# Patient Record
Sex: Female | Born: 1935 | Race: White | Hispanic: No | Marital: Married
Health system: Southern US, Community
[De-identification: ages and names within clinical notes are randomized; demographics above are authoritative.]

---

## 2004-02-09 ENCOUNTER — Other Ambulatory Visit: Payer: Self-pay

## 2004-02-09 ENCOUNTER — Inpatient Hospital Stay: Payer: Self-pay

## 2005-09-10 ENCOUNTER — Emergency Department: Payer: Self-pay | Admitting: Emergency Medicine

## 2009-03-25 ENCOUNTER — Emergency Department: Payer: Self-pay | Admitting: Emergency Medicine

## 2009-11-27 ENCOUNTER — Inpatient Hospital Stay: Payer: Self-pay | Admitting: Internal Medicine

## 2010-04-10 ENCOUNTER — Inpatient Hospital Stay: Payer: Self-pay | Admitting: Internal Medicine

## 2010-06-17 ENCOUNTER — Inpatient Hospital Stay: Payer: Self-pay | Admitting: Internal Medicine

## 2011-03-02 ENCOUNTER — Inpatient Hospital Stay: Payer: Self-pay | Admitting: Internal Medicine

## 2011-03-24 ENCOUNTER — Ambulatory Visit: Payer: Self-pay | Admitting: Oncology

## 2011-04-13 ENCOUNTER — Inpatient Hospital Stay: Payer: Self-pay | Admitting: Internal Medicine

## 2011-04-14 ENCOUNTER — Ambulatory Visit: Payer: Self-pay | Admitting: Internal Medicine

## 2011-04-20 ENCOUNTER — Ambulatory Visit: Payer: Self-pay | Admitting: Oncology

## 2011-04-28 LAB — CBC CANCER CENTER
Eosinophil #: 0.1 x10 3/mm (ref 0.0–0.7)
Eosinophil %: 2.2 %
HCT: 25.2 % — ABNORMAL LOW (ref 35.0–47.0)
HGB: 8.1 g/dL — ABNORMAL LOW (ref 12.0–16.0)
Lymphocyte #: 1.2 x10 3/mm (ref 1.0–3.6)
MCH: 28.3 pg (ref 26.0–34.0)
MCHC: 31.9 g/dL — ABNORMAL LOW (ref 32.0–36.0)
MCV: 88.6 fL (ref 80–100)
Monocyte #: 0.5 x10 3/mm (ref 0.0–0.7)
Neutrophil #: 4.9 x10 3/mm (ref 1.4–6.5)
Neutrophil %: 71.6 %
Platelet: 395 x10 3/mm (ref 150–440)
RBC: 2.85 10*6/uL — ABNORMAL LOW (ref 3.80–5.20)

## 2011-05-03 LAB — CBC CANCER CENTER
Basophil #: 0 x10 3/mm (ref 0.0–0.1)
Basophil %: 0.6 %
Eosinophil %: 1.5 %
HGB: 8 g/dL — ABNORMAL LOW (ref 12.0–16.0)
Lymphocyte %: 15.5 %
Monocyte #: 0.7 x10 3/mm (ref 0.0–0.7)
Neutrophil %: 72.6 %
RBC: 2.83 10*6/uL — ABNORMAL LOW (ref 3.80–5.20)
WBC: 7.2 x10 3/mm (ref 3.6–11.0)

## 2011-05-19 LAB — CBC CANCER CENTER
Basophil #: 0 x10 3/mm (ref 0.0–0.1)
Eosinophil #: 0.1 x10 3/mm (ref 0.0–0.7)
HCT: 28.2 % — ABNORMAL LOW (ref 35.0–47.0)
HGB: 9 g/dL — ABNORMAL LOW (ref 12.0–16.0)
Lymphocyte %: 16.4 %
MCH: 27.7 pg (ref 26.0–34.0)
MCV: 86.8 fL (ref 80–100)
Neutrophil #: 3.5 x10 3/mm (ref 1.4–6.5)
Neutrophil %: 70.9 %
RBC: 3.25 10*6/uL — ABNORMAL LOW (ref 3.80–5.20)

## 2011-05-21 ENCOUNTER — Ambulatory Visit: Payer: Self-pay | Admitting: Oncology

## 2011-06-02 LAB — CANCER CENTER HEMOGLOBIN: HGB: 8.7 g/dL — ABNORMAL LOW (ref 12.0–16.0)

## 2011-06-16 LAB — CANCER CENTER HEMOGLOBIN: HGB: 7.3 g/dL — ABNORMAL LOW (ref 12.0–16.0)

## 2011-06-18 ENCOUNTER — Ambulatory Visit: Payer: Self-pay | Admitting: Oncology

## 2011-07-14 LAB — CANCER CENTER HEMOGLOBIN: HGB: 10.1 g/dL — ABNORMAL LOW (ref 12.0–16.0)

## 2011-07-19 ENCOUNTER — Ambulatory Visit: Payer: Self-pay | Admitting: Oncology

## 2011-07-28 LAB — CANCER CENTER HEMOGLOBIN: HGB: 9 g/dL — ABNORMAL LOW (ref 12.0–16.0)

## 2011-08-02 LAB — CBC CANCER CENTER
Basophil %: 0.6 %
Eosinophil #: 0.1 x10 3/mm (ref 0.0–0.7)
Eosinophil %: 1.8 %
HCT: 26.6 % — ABNORMAL LOW (ref 35.0–47.0)
HGB: 8.6 g/dL — ABNORMAL LOW (ref 12.0–16.0)
Lymphocyte #: 1.1 x10 3/mm (ref 1.0–3.6)
Lymphocyte %: 15.6 %
MCH: 27.5 pg (ref 26.0–34.0)
MCV: 85 fL (ref 80–100)
Monocyte #: 0.7 x10 3/mm (ref 0.2–0.9)
Neutrophil #: 5.1 x10 3/mm (ref 1.4–6.5)
Platelet: 276 x10 3/mm (ref 150–440)
RBC: 3.12 10*6/uL — ABNORMAL LOW (ref 3.80–5.20)
WBC: 7.1 x10 3/mm (ref 3.6–11.0)

## 2011-08-11 LAB — CANCER CENTER HEMOGLOBIN: HGB: 8.4 g/dL — ABNORMAL LOW (ref 12.0–16.0)

## 2011-08-18 ENCOUNTER — Ambulatory Visit: Payer: Self-pay | Admitting: Oncology

## 2011-08-18 LAB — CANCER CENTER HEMOGLOBIN: HGB: 10.9 g/dL — ABNORMAL LOW (ref 12.0–16.0)

## 2011-09-01 LAB — CBC CANCER CENTER
Basophil %: 0.6 %
HCT: 33.7 % — ABNORMAL LOW (ref 35.0–47.0)
Lymphocyte #: 0.9 x10 3/mm — ABNORMAL LOW (ref 1.0–3.6)
MCH: 27.7 pg (ref 26.0–34.0)
MCV: 86 fL (ref 80–100)
Monocyte %: 8.6 %
Neutrophil #: 5.3 x10 3/mm (ref 1.4–6.5)
Platelet: 308 x10 3/mm (ref 150–440)
RBC: 3.9 10*6/uL (ref 3.80–5.20)
RDW: 18 % — ABNORMAL HIGH (ref 11.5–14.5)
WBC: 7 x10 3/mm (ref 3.6–11.0)

## 2011-09-18 ENCOUNTER — Ambulatory Visit: Payer: Self-pay | Admitting: Oncology

## 2011-09-22 LAB — CBC CANCER CENTER
Basophil #: 0 x10 3/mm (ref 0.0–0.1)
Basophil %: 0.7 %
Eosinophil #: 0.1 x10 3/mm (ref 0.0–0.7)
HCT: 24 % — ABNORMAL LOW (ref 35.0–47.0)
HGB: 7.7 g/dL — ABNORMAL LOW (ref 12.0–16.0)
Lymphocyte #: 1 x10 3/mm (ref 1.0–3.6)
Lymphocyte %: 15.3 %
MCH: 27.9 pg (ref 26.0–34.0)
MCHC: 32 g/dL (ref 32.0–36.0)
Monocyte #: 0.6 x10 3/mm (ref 0.2–0.9)
Monocyte %: 9.1 %
Neutrophil #: 4.7 x10 3/mm (ref 1.4–6.5)
Platelet: 301 x10 3/mm (ref 150–440)
RBC: 2.75 10*6/uL — ABNORMAL LOW (ref 3.80–5.20)
RDW: 18.6 % — ABNORMAL HIGH (ref 11.5–14.5)

## 2011-10-04 LAB — CBC CANCER CENTER
Eosinophil #: 0.2 x10 3/mm (ref 0.0–0.7)
Eosinophil %: 2.5 %
HCT: 26.5 % — ABNORMAL LOW (ref 35.0–47.0)
HGB: 8.4 g/dL — ABNORMAL LOW (ref 12.0–16.0)
Lymphocyte %: 16.3 %
MCH: 27.5 pg (ref 26.0–34.0)
MCV: 87 fL (ref 80–100)
Monocyte #: 0.7 x10 3/mm (ref 0.2–0.9)
Neutrophil #: 5.4 x10 3/mm (ref 1.4–6.5)
Platelet: 276 x10 3/mm (ref 150–440)
RBC: 3.05 10*6/uL — ABNORMAL LOW (ref 3.80–5.20)

## 2011-10-18 ENCOUNTER — Ambulatory Visit: Payer: Self-pay | Admitting: Oncology

## 2011-10-19 LAB — CBC CANCER CENTER
Basophil #: 0 x10 3/mm (ref 0.0–0.1)
Basophil %: 0.4 %
Eosinophil #: 0.1 x10 3/mm (ref 0.0–0.7)
Eosinophil %: 1.1 %
Lymphocyte #: 0.7 x10 3/mm — ABNORMAL LOW (ref 1.0–3.6)
MCH: 27.5 pg (ref 26.0–34.0)
MCHC: 32.3 g/dL (ref 32.0–36.0)
MCV: 85 fL (ref 80–100)
Monocyte #: 0.5 x10 3/mm (ref 0.2–0.9)
Neutrophil %: 81.9 %
Platelet: 307 x10 3/mm (ref 150–440)
RBC: 3.09 10*6/uL — ABNORMAL LOW (ref 3.80–5.20)
WBC: 7.4 x10 3/mm (ref 3.6–11.0)

## 2011-10-27 LAB — CANCER CENTER HEMOGLOBIN: HGB: 9.5 g/dL — ABNORMAL LOW (ref 12.0–16.0)

## 2011-11-10 LAB — CANCER CENTER HEMOGLOBIN: HGB: 7.6 g/dL — ABNORMAL LOW (ref 12.0–16.0)

## 2011-11-17 LAB — CANCER CENTER HEMOGLOBIN: HGB: 9.3 g/dL — ABNORMAL LOW (ref 12.0–16.0)

## 2011-11-18 ENCOUNTER — Ambulatory Visit: Payer: Self-pay | Admitting: Oncology

## 2011-11-24 LAB — CANCER CENTER HEMOGLOBIN: HGB: 9.6 g/dL — ABNORMAL LOW (ref 12.0–16.0)

## 2011-12-01 LAB — CANCER CENTER HEMOGLOBIN: HGB: 9.1 g/dL — ABNORMAL LOW (ref 12.0–16.0)

## 2011-12-15 LAB — CBC CANCER CENTER
Basophil %: 0.9 %
Eosinophil #: 0.2 x10 3/mm (ref 0.0–0.7)
HCT: 27.3 % — ABNORMAL LOW (ref 35.0–47.0)
HGB: 8.2 g/dL — ABNORMAL LOW (ref 12.0–16.0)
MCH: 24.2 pg — ABNORMAL LOW (ref 26.0–34.0)
MCHC: 30.1 g/dL — ABNORMAL LOW (ref 32.0–36.0)
Monocyte #: 0.6 x10 3/mm (ref 0.2–0.9)
Neutrophil #: 4.8 x10 3/mm (ref 1.4–6.5)
RDW: 16.3 % — ABNORMAL HIGH (ref 11.5–14.5)
WBC: 6.8 x10 3/mm (ref 3.6–11.0)

## 2011-12-19 ENCOUNTER — Ambulatory Visit: Payer: Self-pay | Admitting: Oncology

## 2011-12-22 LAB — CBC CANCER CENTER
Basophil #: 0.1 x10 3/mm (ref 0.0–0.1)
Basophil %: 1 %
Eosinophil #: 0.2 x10 3/mm (ref 0.0–0.7)
HCT: 26.8 % — ABNORMAL LOW (ref 35.0–47.0)
HGB: 8.1 g/dL — ABNORMAL LOW (ref 12.0–16.0)
Lymphocyte %: 16.3 %
MCHC: 30.2 g/dL — ABNORMAL LOW (ref 32.0–36.0)
Monocyte #: 0.6 x10 3/mm (ref 0.2–0.9)
Neutrophil %: 69.8 %
RDW: 16.7 % — ABNORMAL HIGH (ref 11.5–14.5)
WBC: 6.1 x10 3/mm (ref 3.6–11.0)

## 2011-12-26 LAB — COMPREHENSIVE METABOLIC PANEL
Alkaline Phosphatase: 44 U/L — ABNORMAL LOW (ref 50–136)
Anion Gap: 8 (ref 7–16)
Calcium, Total: 8.9 mg/dL (ref 8.5–10.1)
Chloride: 94 mmol/L — ABNORMAL LOW (ref 98–107)
Co2: 30 mmol/L (ref 21–32)
Creatinine: 0.97 mg/dL (ref 0.60–1.30)
EGFR (African American): 58 — ABNORMAL LOW
Osmolality: 272 (ref 275–301)
SGOT(AST): 23 U/L (ref 15–37)
SGPT (ALT): 20 U/L (ref 12–78)
Sodium: 132 mmol/L — ABNORMAL LOW (ref 136–145)
Total Protein: 6.8 g/dL (ref 6.4–8.2)

## 2011-12-26 LAB — URINALYSIS, COMPLETE
Bilirubin,UR: NEGATIVE
Leukocyte Esterase: NEGATIVE
Ph: 5 (ref 4.5–8.0)
Protein: 30
RBC,UR: 4 /HPF (ref 0–5)
Squamous Epithelial: NONE SEEN

## 2011-12-26 LAB — CBC
HGB: 8 g/dL — ABNORMAL LOW (ref 12.0–16.0)
MCH: 23.4 pg — ABNORMAL LOW (ref 26.0–34.0)
MCHC: 30.1 g/dL — ABNORMAL LOW (ref 32.0–36.0)
MCV: 78 fL — ABNORMAL LOW (ref 80–100)

## 2011-12-27 ENCOUNTER — Inpatient Hospital Stay: Payer: Self-pay | Admitting: Internal Medicine

## 2011-12-27 LAB — TROPONIN I: Troponin-I: 0.09 ng/mL — ABNORMAL HIGH

## 2011-12-28 LAB — BASIC METABOLIC PANEL
Anion Gap: 7 (ref 7–16)
Chloride: 91 mmol/L — ABNORMAL LOW (ref 98–107)
Co2: 33 mmol/L — ABNORMAL HIGH (ref 21–32)
Creatinine: 0.85 mg/dL (ref 0.60–1.30)
Osmolality: 268 (ref 275–301)
Potassium: 4 mmol/L (ref 3.5–5.1)

## 2011-12-28 LAB — DIGOXIN LEVEL: Digoxin: 1.26 ng/mL

## 2011-12-29 LAB — BASIC METABOLIC PANEL
Calcium, Total: 9.2 mg/dL (ref 8.5–10.1)
Chloride: 92 mmol/L — ABNORMAL LOW (ref 98–107)
Co2: 33 mmol/L — ABNORMAL HIGH (ref 21–32)
EGFR (African American): >60
Glucose: 116 mg/dL — ABNORMAL HIGH (ref 65–99)
Osmolality: 266 (ref 275–301)
Potassium: 4.2 mmol/L (ref 3.5–5.1)
Sodium: 131 mmol/L — ABNORMAL LOW (ref 136–145)

## 2011-12-29 LAB — HEMOGLOBIN: HGB: 8.9 g/dL — ABNORMAL LOW (ref 12.0–16.0)

## 2011-12-30 LAB — BASIC METABOLIC PANEL
Anion Gap: 5 — ABNORMAL LOW (ref 7–16)
Calcium, Total: 9 mg/dL (ref 8.5–10.1)
Chloride: 92 mmol/L — ABNORMAL LOW (ref 98–107)
Co2: 33 mmol/L — ABNORMAL HIGH (ref 21–32)
Creatinine: 0.77 mg/dL (ref 0.60–1.30)
Potassium: 4.1 mmol/L (ref 3.5–5.1)
Sodium: 130 mmol/L — ABNORMAL LOW (ref 136–145)

## 2011-12-30 LAB — HEMOGLOBIN: HGB: 8.9 g/dL — ABNORMAL LOW (ref 12.0–16.0)

## 2012-01-18 ENCOUNTER — Ambulatory Visit: Payer: Self-pay | Admitting: Oncology

## 2012-01-18 DEATH — deceased

## 2013-09-21 IMAGING — CR DG CHEST 1V PORT
1 series · 1 of 1 positions shown · non-contrast
Comparison: none

REASON FOR EXAM: weakness
COMMENTS:

PROCEDURE:     DXR - DXR PORTABLE CHEST SINGLE VIEW  - March 02, 2011  [DATE]
RESULT:     Comparison: 06/17/2010

[view not recorded]
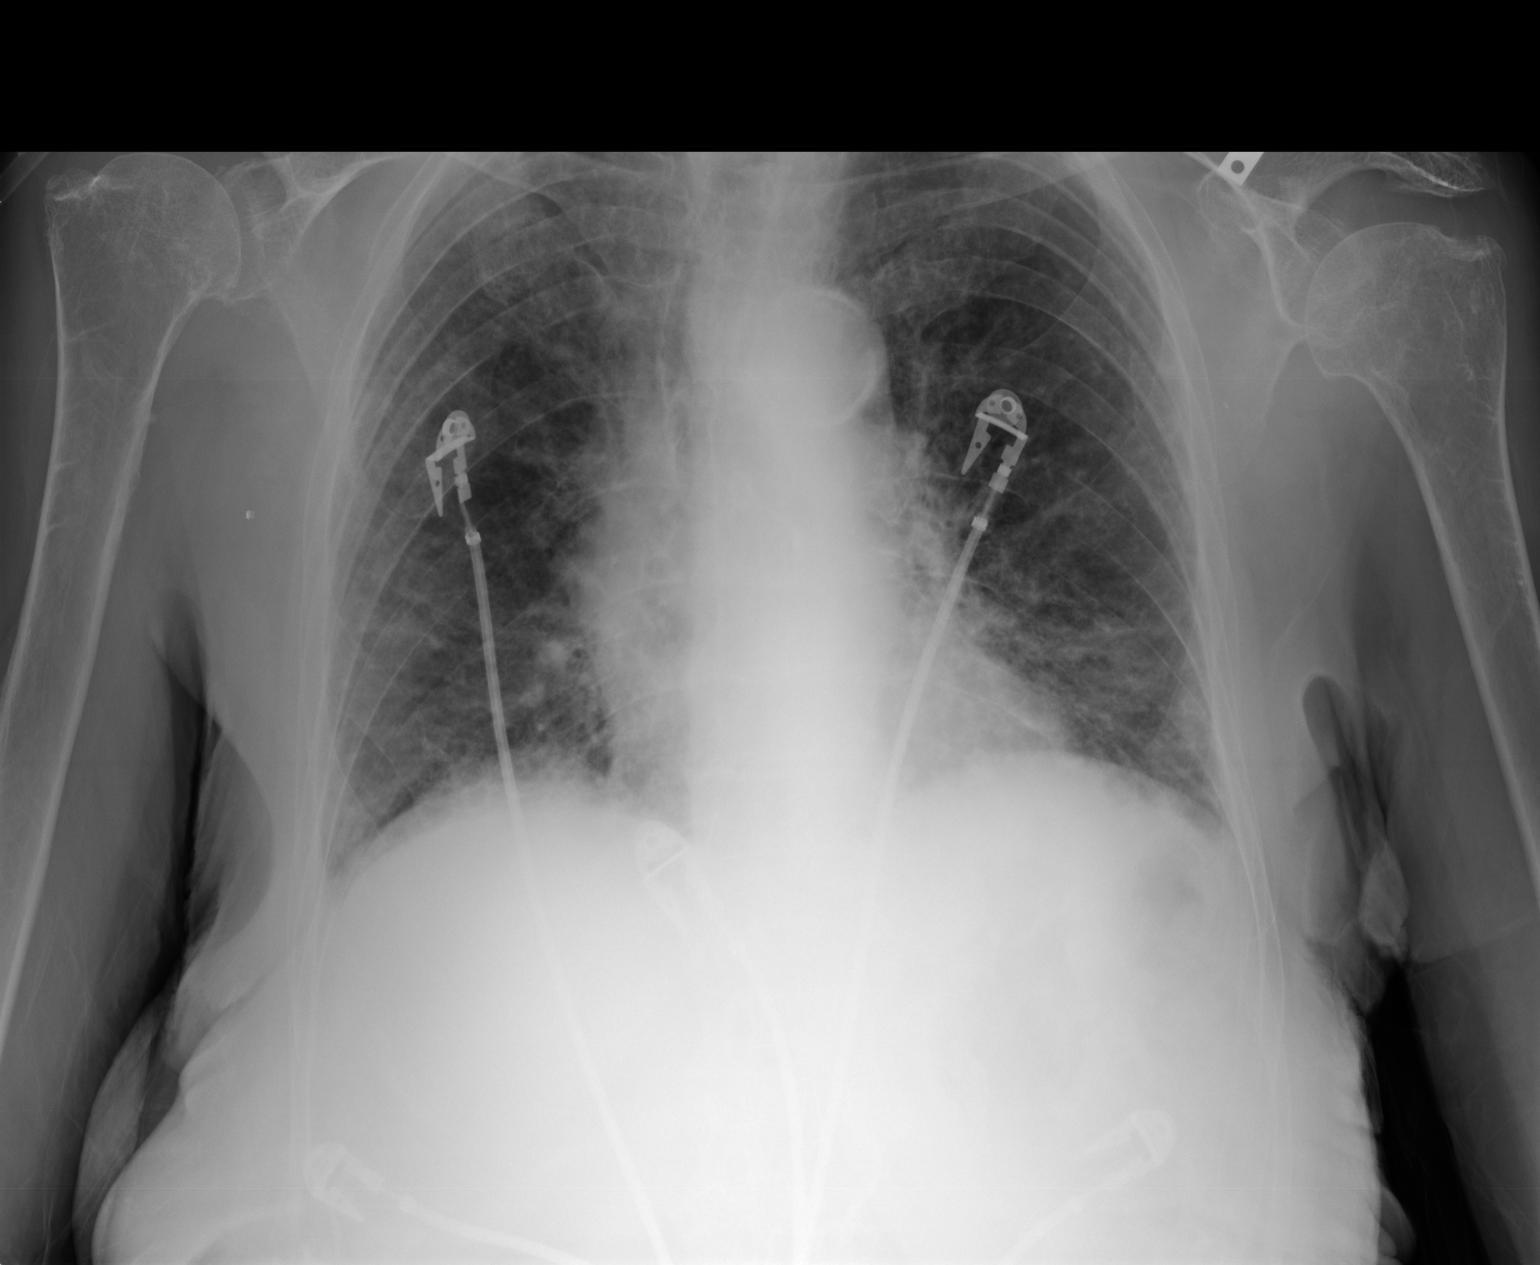

[1 of 1 positions shown; findings below may reference images not displayed]

FINDINGS: The lung volumes are low. The heart and mediastinum are stable, given the
lower lung volumes. There are bilateral interstitial pulmonary opacities,
which likely represent a combination of interstitial pulmonary edema and
atelectasis.
IMPRESSION: Findings likely represent a combination of interstitial pulmonary edema and
atelectasis. Infection would be difficult to exclude. Followup PA and
lateral chest radiographs are recommended.

## 2013-11-01 IMAGING — CR DG CHEST 1V PORT
1 series · 1 of 1 positions shown · non-contrast
Comparison: none

REASON FOR EXAM: SOB
COMMENTS:

[ap]
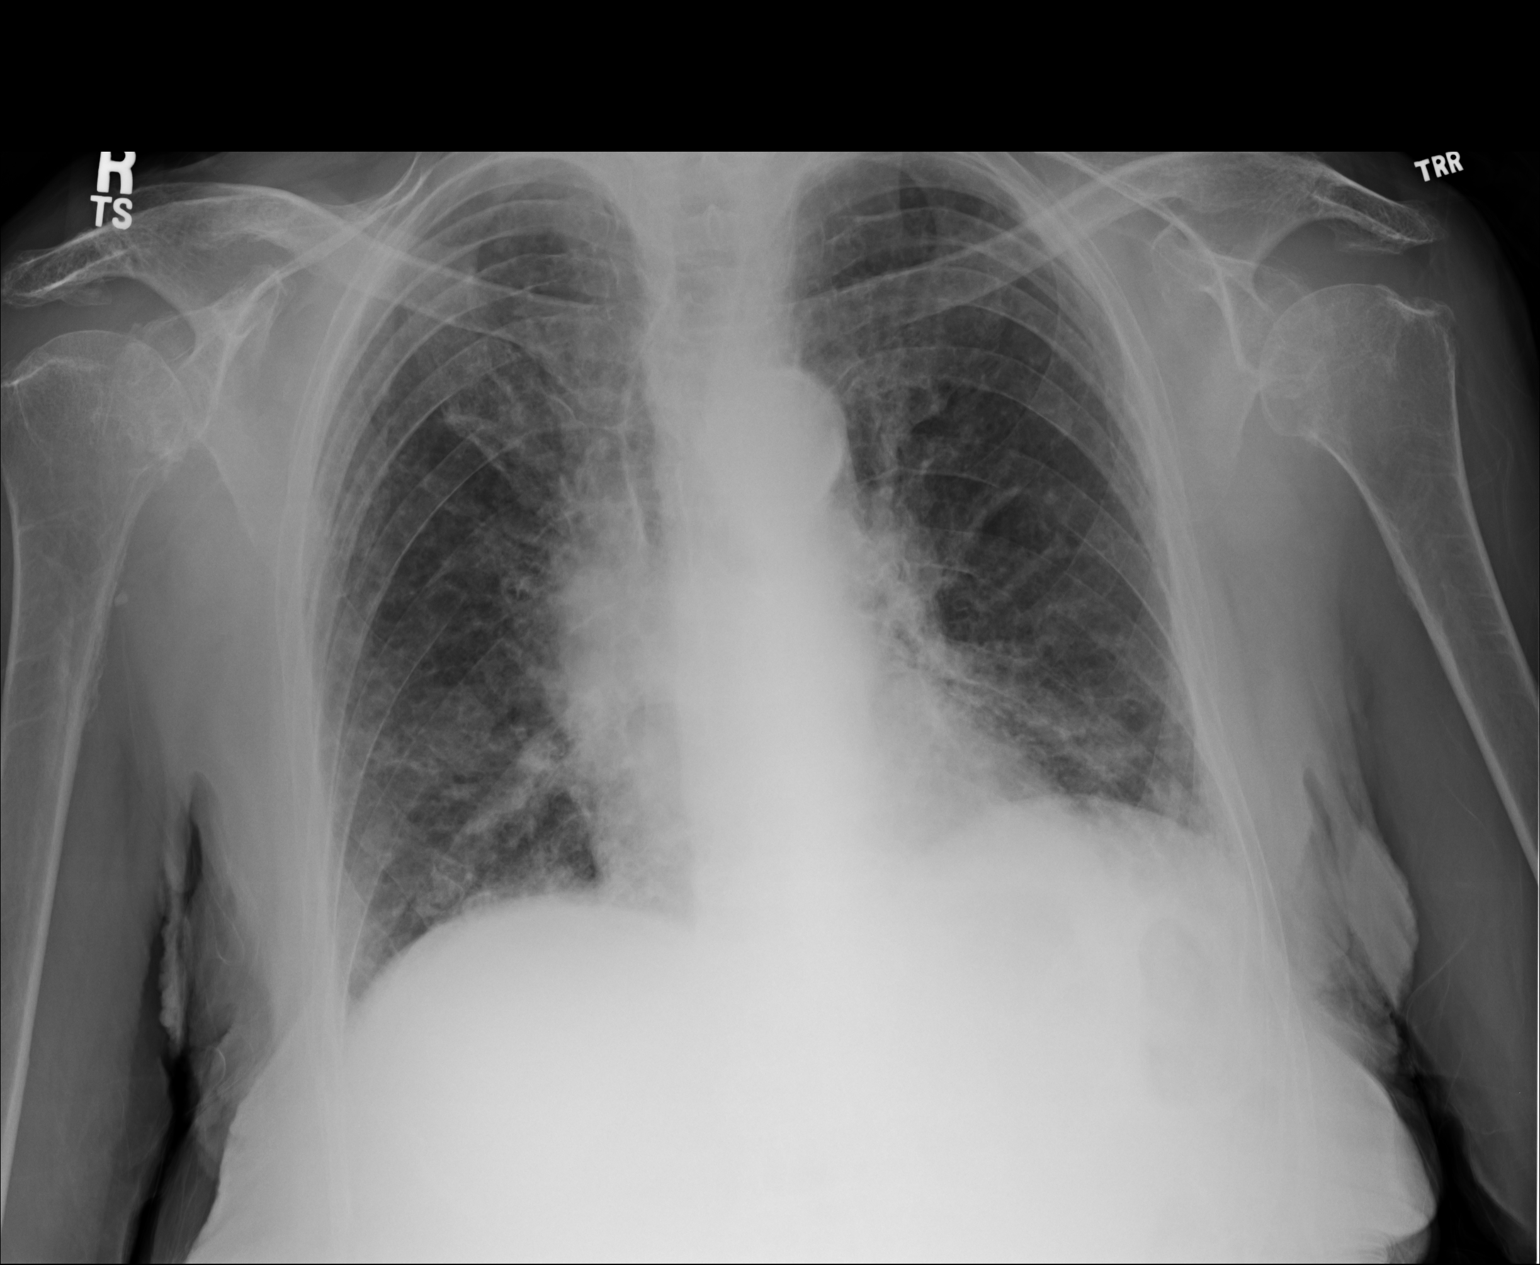

[1 of 1 positions shown; findings below may reference images not displayed]

PROCEDURE:     DXR - DXR PORTABLE CHEST SINGLE VIEW  - April 12, 2011 [DATE]

RESULT:     Comparison is made to the study 02 March, 2011.

The lungs are adequately inflated. The interstitial markings are increased
and the pulmonary vascularity is engorged. The cardiac silhouette is top
normal in size.
IMPRESSION: The findings are consistent with congestive heart failure
and pulmonary interstitial edema. There has been worsening since the
previous study.

## 2014-07-17 IMAGING — CR PELVIS - 1-2 VIEW
1 series · 1 of 1 positions shown · non-contrast
Comparison: none

REASON FOR EXAM: low abd p[ain after fall
COMMENTS:

[t pelvis ap]
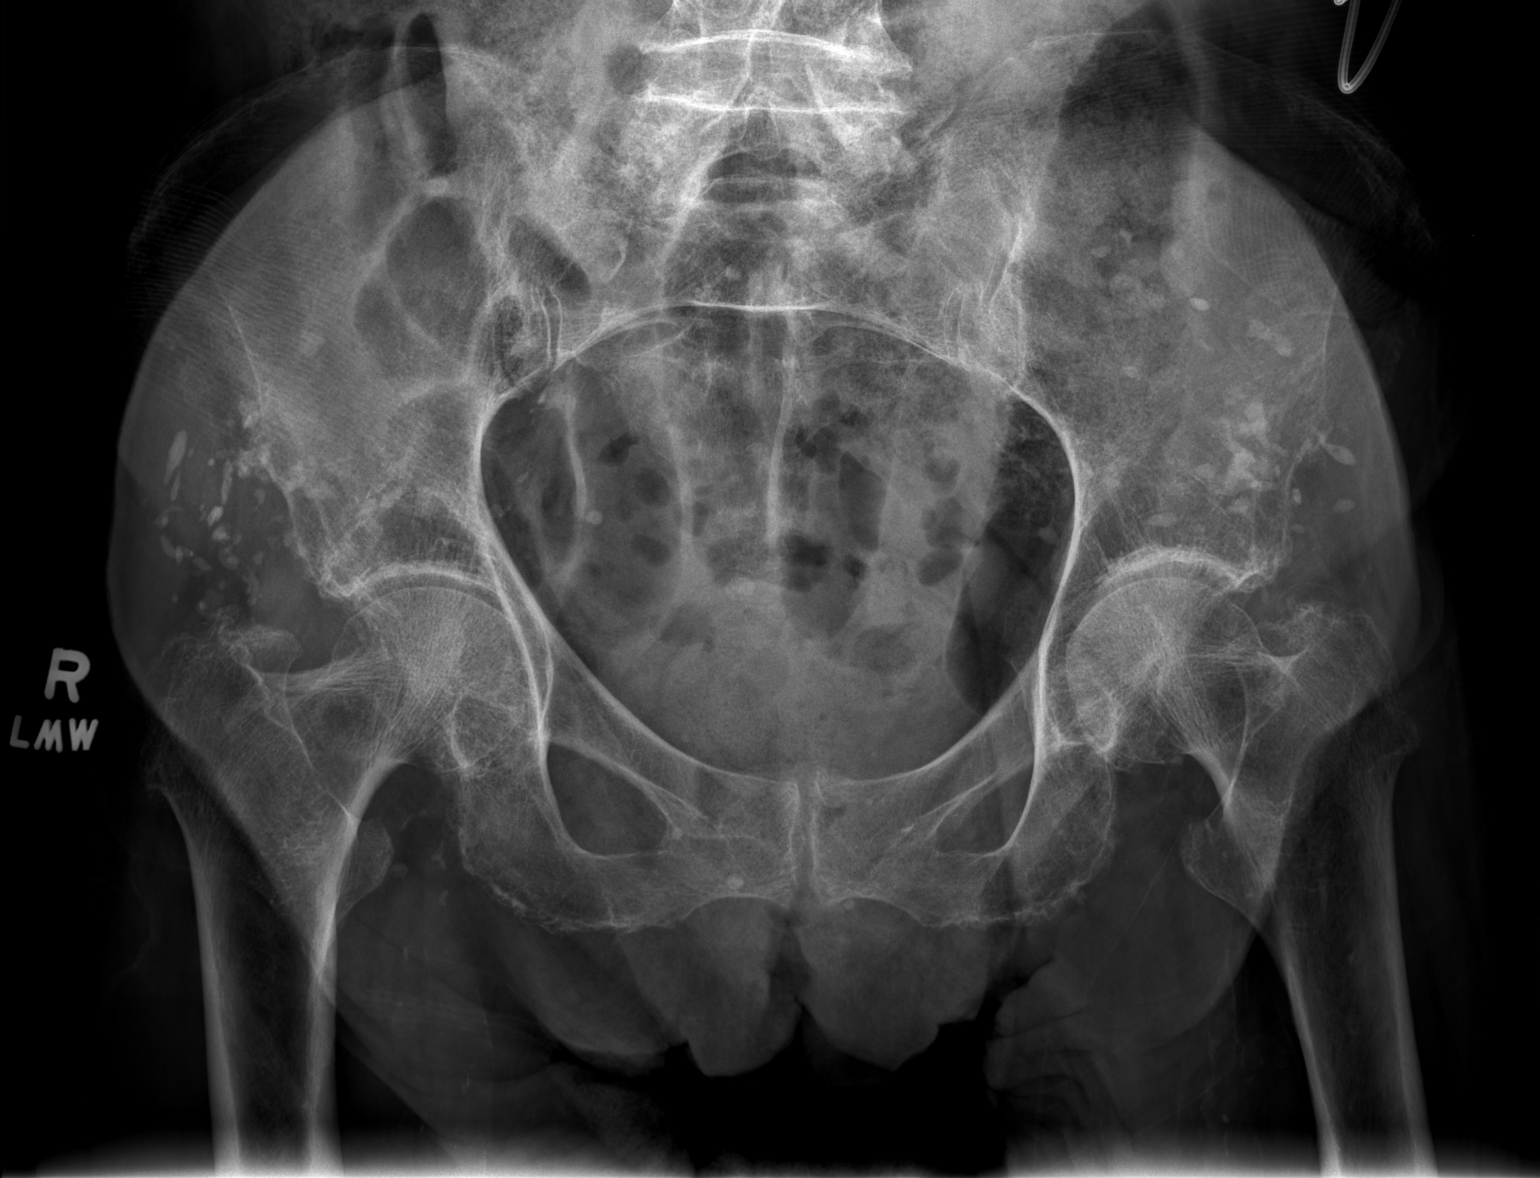

[1 of 1 positions shown; findings below may reference images not displayed]

PROCEDURE:     DXR - DXR PELVIS AP ONLY  - December 26, 2011  [DATE]

RESULT:     The bony pelvis is diffusely osteopenic. There is no evidence of
an acute fracture. Numerous soft tissue calcifications are present over the
gluteal regions which may reflect injection granulomas. The bowel gas
pattern is nonspecific.
IMPRESSION: There is no acute bony abnormality of the pelvis. There is
diffuse osteopenia. Followup imaging is available if there are strong
clinical concerns of an occult fracture.

[REDACTED]

## 2014-08-06 NOTE — H&P (Signed)
PATIENT NAME:  Savannah Pollard, Savannah Pollard MR#:  [id number] DATE OF BIRTH:  October 22, 1916  DATE OF ADMISSION:  12/26/2011  PRIMARY CARE PHYSICIAN: Dr. Barry Brunner.  HEMATOLOGIST: Dr. Orlie Dakin.   CHIEF COMPLAINT: She fell at home and complaining of backache. She also has mild shortness of breath.   HISTORY OF PRESENT ILLNESS: Savannah Pollard is a 81 year old Caucasian female who lives at home cared by her daughters and around-the-clock sitters. The patient was found today on the floor when the sitter came back home. The patient does not give adequate history of what happened exactly. They called EMS and the patient was transported to the Emergency Department for evaluation. Her complaint is backache. She also had mild headache after the fall. The daughter had noticed that she had some increased shortness of breath in the last couple of days. The patient is minimizing that and she is focusing on the backache. She denies having any chest pain. No fever. No cough. The patient had workup in the Emergency Department and that included chest x-ray, x-ray of the pelvis, x-ray of the thoracic and lumbar spine, CT scan of the head and cervical spine as well. The findings were unremarkable except for suggestion of possible fluid overload or congestive heart failure superimposing the pulmonary fibrosis as evident by the chest x-ray. The patient was admitted for 24-hour observation and also to manage the volume overload and pain control.   REVIEW OF SYSTEMS: Ten point system review was very difficult to obtain due to the patient hard of hearing. CONSTITUTIONAL: Denies any fever. No chills. No fatigue, although she has mild fatigue. This is chronic for her. EYES: No blurring of vision. No double vision. ENT: No sore throat. No dysphagia. She has hearing impairment. This is chronic. CARDIOVASCULAR: No chest pain. She denies significant shortness of breath. However, the daughter reports that in the last couple of days she was noticed to be  short of breath. No peripheral edema reported. RESPIRATORY: Mild shortness of breath in the last couple of days. No chest pain. No cough. No hemoptysis. GASTROINTESTINAL: No abdominal pain. No vomiting, no diarrhea. GENITOURINARY: No dysuria. No frequency of urination. MUSCULOSKELETAL: No joint pain or swelling other than the backache. No muscular pain or swelling. INTEGUMENTARY: No skin rash. No ulcers. NEUROLOGY: No focal weakness other than residual left-sided weakness from previous old stroke. No seizure activity. She reports mild headache after the fall. PSYCHIATRY: No anxiety or depression. ENDOCRINE: No polyuria or polydipsia. No heat or cold intolerance.   PAST MEDICAL HISTORY:  1. History of stroke and multiple transient ischemic attacks with residual left-sided weakness. 2. Diabetes mellitus type 2. 3. Hypertension. 4. Atrial fibrillation.  5. Chronic obstructive pulmonary disease.  6. Pulmonary fibrosis.  7. She is on 2 liters of oxygen at bedtime.  8. Severe aortic stenosis. 9. History of chronic diastolic congestive heart failure with normal systolic function.  10. Hyperlipidemia.  11. Gastroesophageal reflux disease.  12. Iron deficiency anemia and also suspected to have myelodysplastic syndrome or MDS followed by Dr. Orlie Dakin.  13. Coronary artery disease. 14. Osteoporosis. 15. Osteoarthritis.   PAST SURGICAL HISTORY:  1. Hysterectomy. 2. Cataract surgery.   SOCIAL HABITS: Nonsmoker. No history of alcohol or drug abuse.   SOCIAL HISTORY: She is widowed. Lives at home, cared for by her daughters and two sisters, one of them stays from 5:30 in the evening and she will leave at 7 in the morning. She has another sister who comes at 7:00 in the morning, then  leaves at 10:00 in the morning. In between, she has her son and daughters who check on her. Her CODE STATUS is DO NOT RESUSCITATE. She has a LIVING WILL.   FAMILY HISTORY: Her mother suffered from a heart attack. She has a  brother who also had a heart attack. She has two brothers and one sister who had cancer, the type is unknown.   ADMISSION MEDICATIONS:  1. Tylenol arthritis p.r.n. for pain. 2. Prilosec 20 mg a day.  3. Potassium chloride 20 mEq a day. 4. Lasix 20 mg daily. 5. Oxycodone 5 to 10 mg p.r.n. for pain.  6. Metoprolol tartrate 25 mg taking 1/2 tablet twice a day. 7. Metformin 500 mg twice a day. 8. Lipitor 40 mg at night. 9. Digoxin 0.125 mg once a day. 10. Fosamax 70 mg once a week on Saturdays.  11. Iron sulfate 325 mg twice a day.  12. Calcium with vitamin D 600 mg twice a day.   ALLERGIES: Levaquin causing skin rash. Plavix and aspirin. the type of allergy is not specified, but possibly related to the low platelets and precautions not to use them.   PHYSICAL EXAMINATION:  VITAL SIGNS: Blood pressure 119/69, respiratory rate 18, pulse 100, temperature 96.9, oxygen saturation 94%.   GENERAL: Elderly female lying in bed in no acute distress.   HEAD AND NECK EXAMINATION: Remarkable for moderate pallor. No icterus. No cyanosis.   ENT: She is hard of hearing and difficult to communicate with her. Nasal mucosa, lips, tongue were normal.   EYES: Normal eyelids and conjunctivae although the conjunctivae are slightly pale. Pupils are about 4 to 5 mm, equal and sluggishly reactive to light.   NECK: Supple. Trachea at midline. No cervical masses. No lymphadenopathies.   HEART: Normal S1, S2. There is a grade 4/6 systolic murmur heard at the apex and left sternal border but best heard at the aortic area. No carotid bruits.   LUNGS: Normal breathing pattern without use of accessory muscles. No rales. No wheezing.   ABDOMEN: Soft without tenderness. No hepatosplenomegaly. No masses. No hernias.   SKIN: No ulcers. No subcutaneous nodules.   MUSCULOSKELETAL: No joint swelling. No clubbing. She is able to move her upper extremities without any problem. She can also move her feet and bend the right  knee and left knee with no difficulty.   NEUROLOGY: Cranial nerves II through XII are intact. No focal motor deficit. There is a suggestion of mild left-sided weakness, especially in the left leg. This is an old finding from her old stroke.   PSYCHIATRY: The patient is alert, oriented to place and people. Mood and affect were normal.   LABORATORY, DIAGNOSTIC, AND RADIOLOGICAL DATA: Her chest x-ray showed findings consistent with her pulmonary fibrosis. There may be superimposed congestive heart failure bilaterally. X-ray of the pelvis showed no acute bony abnormality in the pelvis. There is diffuse osteopenia. X-ray of the thoracic spine showed chronic compression of T12, but no acute compression. X-ray of the lumbar spine showed no evidence of lumbar compression fracture. CT scan of the head showed chronic ischemic changes. No evidence of acute intracranial hemorrhage or any acute abnormality. The CT scan of the cervical spine showed no evidence of acute cervical spine fracture or dislocation. There are degenerative changes in the mid cervical spine. There are emphysematous changes of the apices of the lungs, which also reveals some multiple small subcentimeter nodules. EKG showed sinus tachycardia, first degree AV block. Q waves in V1 and V2  consistent with old anteroseptal myocardial infarction. Nonspecific ST-T wave abnormalities in the lateral leads. Serum glucose 164. B-type natriuretic peptide (BNP) was elevated at 2,457. BUN 24, creatinine 0.9, sodium 132, potassium 4.4. Normal liver function tests and normal liver enzymes. Troponin 0.04. CBC showed white count of 7.900, hemoglobin 8. Her hemoglobin a few days ago was 8.1. Hematocrit 26, platelet count 317. Low indices of MCV, MCH, and MCHC. Urinalysis showed cloudy urine, 4 white blood cells, 4 RBCs, +3 bacteria.   ASSESSMENT:  1. Syncope. The etiology and the circumstances of her syncope are unclear. The patient is not a good historian.  2. Acute  on chronic diastolic congestive heart failure.  3. Mild hyponatremia.  4. Back pain status post fall without evidence of fracture.  5. Pulmonary fibrosis. 6. Chronic obstructive pulmonary disease, home oxygen dependent on 2 liters at night.  7. Severe aortic stenosis.  8. Diabetes mellitus, type II. 9. Hypertension.  10. Hyperlipidemia.  11. Gastroesophageal reflux disease.  12. History of stroke with residual mild left hemiparesis.  13. Multiple transient ischemic attacks.  14. Coronary artery disease. 15. Osteoporosis. 16. Osteoarthritis.   PLAN:  1. Will admit the patient for observation.  2. The patient received one dose of Lasix 40 mg in the Emergency Department. I will give her another one dose in a few hours. I will hold the oral Lasix and can be resumed at time of discharge.  3. Oxygen supplementation at night.  4. Follow up on troponin x2.  5. Monitor the patient over telemetry.  6. Pain control with oxycodone p.r.n. and Tylenol if needed.  7. Consult physical therapy to ensure that the patient is able to go back to her usual state. According to the daughter, she is able to use the walker and the wheelchair also during ambulation.  8. I will continue the rest of her home medications.  9. The patient has a LIVING WILL and it stated that she has a CODE STATUS of DO NOT RESUSCITATE. This is confirmed by her daughter and also her old records in this hospital.   TIME SPENT IN EVALUATING THIS PATIENT: More than 55 minutes.   ____________________________ Carney CornersAmir M. Rudene Rearwish, MD amd:ap D: 12/26/2011 23:13:46 ET T: 12/27/2011 09:15:59 ET JOB#: [id number]  cc: Carney CornersAmir M. Rudene Rearwish, MD, <Dictator> Jorje GuildGlenn R. Beckey DowningWillett, MD Zollie ScaleAMIR M Clarice Zulauf MD ELECTRONICALLY SIGNED 12/27/2011 22:25

## 2014-08-06 NOTE — Discharge Summary (Signed)
PATIENT NAME:  Savannah Pollard, Savannah Pollard MR#:  [id number] DATE OF BIRTH:  28-Dec-1916  DATE OF ADMISSION:  12/27/2011 DATE OF DISCHARGE:  12/30/2011  PRIMARY CARE PHYSICIAN: Dr. Beckey Downing    FINAL DIAGNOSES:  1. Acute diastolic congestive heart failure with severe aortic stenosis, severe mitral regurgitation and relative hypotension.  2. Symptomatic anemia.  3. Syncope.  4. Back pain with worsening compression fracture.  5. Respiratory failure.  6. Hyponatremia.  7. Hyperlipidemia.  8. Diabetes.   CODE STATUS: Patient is a DO NOT RESUSCITATE.   MEDICATIONS UPON DISCHARGE:  1. Fosamax 70 mg once a week on Saturdays. 2. Metformin 500 mg twice a day. 3. Calcium and vitamin D 1 tablet twice a day.  4. Ferrous sulfate 325 mg twice a day. 5. Lasix 20 mg daily.  6. Potassium chloride 20 mEq daily.  7. Acetaminophen 650 mg orally every four hours as needed for pain or temperature.  8. Lipitor 40 mg daily.  9. Epogen 40,000 units subcutaneous per week on Wednesday.  10. Colace 100 mg twice a day.  11. Stop Lanoxin. Stop metoprolol.   OXYGEN: 2 liters nasal cannula.   DIET: Low sodium diet, regular consistency.   ACTIVITY: Activity as tolerated with physical therapy.   FOLLOW UP: Follow up in 1 to 2 days with doctor at rehab. Of note, patient will follow up with hospice after discharge from rehab. Need to check hemoglobin every Monday with results to go to Dr. Orlie Dakin, hematology.    REASON FOR ADMISSION: Patient was admitted 12/26/2011, discharged 12/30/2011 to rehab. Came in with fall at home and back pain and shortness of breath.   HISTORY OF PRESENT ILLNESS: 81 year old female who lives at home. Does have sitters at home. Transported after a fall and back pain. The fall was a mechanical fall as per the patient.   LABORATORY, DIAGNOSTIC AND RADIOLOGICAL DATA: BMP 2457. Troponin negative. Glucose 164, BUN 24, creatinine 0.97, sodium 132, potassium 4.4, chloride 94, CO2 30, calcium 8.9. Liver  function tests: Alkaline phosphatase low at 44. Other liver function tests normal. White blood cell count 7.9, hemoglobin and hematocrit 8.0 and 26.5, platelet count 317. CT scan of the cervical spine negative for fracture. EKG showed sinus tachycardia, first-degree AV block, septal infarct. CT scan of the head: No acute intracranial hemorrhage. Lumbar spine x-ray: Compression fracture at T12 50%. Thoracic spine showed compression fracture T12 50%. Pelvis x-ray negative. Urinalysis negative. Chest x-ray: Findings consistent with pulmonary fibrosis. May be a superimposed congestive heart failure. Troponin borderline at 0.09, troponin borderline at 0.07. Hemoglobin dipped down to 7.2 on the 10th, patient was transfused. Pulse oximetry 75% on room air on the 10th. Hemoglobin after transfusion up to 8.9 and stable at 8.9. Repeat thoracic x-ray on the 11th showed has been further compression of the previously partial compressed T12 vertebral body at least 80%.   HOSPITAL COURSE PER PROBLEM LIST:  1. For the patient's acute congestive heart failure which is diastolic with severe aortic stenosis, severe mitral regurgitation and relative hypotension, this will be difficult to manage. Patient is on low-dose Lasix only. Unable to tolerate a beta blocker secondary to bradycardia and hypotension. ACE inhibitor is contraindicated secondary to severe aortic stenosis. The combination of congestive heart failure, severe aortic stenosis and syncope is a poor prognostic indicator. Hospice consultation was done during the hospital stay and she will follow with hospice once done with rehab.  2. For the patient's symptomatic anemia, she was transfused 1 unit of packed  red blood cells. She follows up with Dr. Orlie DakinFinnegan as outpatient. I recommend checking a hemoglobin on a weekly basis and giving Procrit and iron. If hemoglobin goes above 12 can stop the Procrit. Weekly hemoglobins should be on Monday and Procrit injections on  Wednesday.  3. Patient did have a syncopal episode here with bradycardia and her metoprolol and digoxin were stopped. With the severe aortic stenosis poor prognosis.  4. Back pain with worsening compression fracture. Case discussed with daughter at the bedside. Does not want to put the patient through a surgical procedure. I canceled the MRI. Patient will go to rehab. Patient is on calcium and vitamin D and Fosamax for osteoporosis.  5. For the patient's respiratory failure, patient will need 24/7 oxygen.  6. Hyponatremia secondary to congestive heart failure.  7. Hyperlipidemia on Lipitor.  8. Diabetes on Glucophage.  9. CODE STATUS: Patient is a DO NOT RESUSCITATE. Overall prognosis poor with the severe aortic stenosis, congestive heart failure and syncope. Hospice follow up as outpatient once done with rehab.   ____________________________ Herschell Dimesichard J. Renae GlossWieting, MD rjw:cms D: 12/30/2011 08:59:52 ET T: 12/30/2011 09:40:23 ET JOB#: [id number]  cc: Herschell Dimesichard J. Renae GlossWieting, MD, <Dictator> Jorje GuildGlenn R. Beckey DowningWillett, MD Tollie Pizzaimothy J. Orlie DakinFinnegan, MD Rehab facility   Salley ScarletICHARD J Tania Steinhauser MD ELECTRONICALLY SIGNED 12/31/2011 16:53
